# Patient Record
Sex: Male | Born: 1935 | Marital: Married
Health system: Southern US, Community
[De-identification: ages and names within clinical notes are randomized; demographics above are authoritative.]

---

## 2010-06-28 ENCOUNTER — Inpatient Hospital Stay: Payer: Self-pay | Admitting: Internal Medicine

## 2011-01-17 ENCOUNTER — Emergency Department: Payer: Self-pay | Admitting: Unknown Physician Specialty

## 2013-02-18 ENCOUNTER — Encounter: Payer: Self-pay | Admitting: Neurology

## 2013-03-09 ENCOUNTER — Emergency Department: Payer: Self-pay | Admitting: Emergency Medicine

## 2013-03-09 LAB — BASIC METABOLIC PANEL
Anion Gap: 4 — ABNORMAL LOW (ref 7–16)
BUN: 12 mg/dL (ref 7–18)
Calcium, Total: 9.3 mg/dL (ref 8.5–10.1)
Chloride: 107 mmol/L (ref 98–107)
Co2: 27 mmol/L (ref 21–32)
EGFR (African American): >60
EGFR (Non-African Amer.): 55 — ABNORMAL LOW
Osmolality: 275 (ref 275–301)
Potassium: 4.3 mmol/L (ref 3.5–5.1)
Sodium: 138 mmol/L (ref 136–145)

## 2013-03-09 LAB — CBC
HGB: 15.1 g/dL (ref 13.0–18.0)
MCH: 30.4 pg (ref 26.0–34.0)
MCHC: 34.4 g/dL (ref 32.0–36.0)
RDW: 14.2 % (ref 11.5–14.5)
WBC: 6 10*3/uL (ref 3.8–10.6)

## 2013-03-09 LAB — TSH: Thyroid Stimulating Horm: 0.026 u[IU]/mL — ABNORMAL LOW

## 2013-03-20 ENCOUNTER — Encounter: Payer: Self-pay | Admitting: Neurology

## 2013-04-20 ENCOUNTER — Encounter: Payer: Self-pay | Admitting: Neurology

## 2013-12-08 ENCOUNTER — Inpatient Hospital Stay: Payer: Self-pay | Admitting: Internal Medicine

## 2013-12-08 LAB — TSH
Thyroid Stimulating Horm: 0.552 u[IU]/mL
Thyroid Stimulating Horm: 0.56 u[IU]/mL

## 2013-12-08 LAB — COMPREHENSIVE METABOLIC PANEL
ANION GAP: 5 — AB (ref 7–16)
AST: 26 U/L (ref 15–37)
Albumin: 2.8 g/dL — ABNORMAL LOW (ref 3.4–5.0)
Alkaline Phosphatase: 65 U/L
BUN: 29 mg/dL — ABNORMAL HIGH (ref 7–18)
Bilirubin,Total: 1 mg/dL (ref 0.2–1.0)
CALCIUM: 9.2 mg/dL (ref 8.5–10.1)
CO2: 28 mmol/L (ref 21–32)
Chloride: 103 mmol/L (ref 98–107)
Creatinine: 1.43 mg/dL — ABNORMAL HIGH (ref 0.60–1.30)
EGFR (Non-African Amer.): 45 — ABNORMAL LOW
GFR CALC AF AMER: 52 — AB
GLUCOSE: 105 mg/dL — AB (ref 65–99)
Osmolality: 278 (ref 275–301)
POTASSIUM: 3.5 mmol/L (ref 3.5–5.1)
SGPT (ALT): 17 U/L (ref 12–78)
Sodium: 136 mmol/L (ref 136–145)
TOTAL PROTEIN: 7.9 g/dL (ref 6.4–8.2)

## 2013-12-08 LAB — URINALYSIS, COMPLETE
Bilirubin,UR: NEGATIVE
Glucose,UR: NEGATIVE mg/dL (ref 0–75)
Leukocyte Esterase: NEGATIVE
Nitrite: NEGATIVE
Ph: 5 (ref 4.5–8.0)
Specific Gravity: 1.023 (ref 1.003–1.030)
Squamous Epithelial: NONE SEEN
WBC UR: 1 /HPF (ref 0–5)

## 2013-12-08 LAB — CK-MB: CK-MB: 2.2 ng/mL (ref 0.5–3.6)

## 2013-12-08 LAB — CBC
HCT: 41.7 % (ref 40.0–52.0)
HGB: 14.2 g/dL (ref 13.0–18.0)
MCH: 31 pg (ref 26.0–34.0)
MCHC: 34 g/dL (ref 32.0–36.0)
MCV: 91 fL (ref 80–100)
PLATELETS: 161 10*3/uL (ref 150–440)
RBC: 4.58 10*6/uL (ref 4.40–5.90)
RDW: 13.3 % (ref 11.5–14.5)
WBC: 7.9 10*3/uL (ref 3.8–10.6)

## 2013-12-08 LAB — TROPONIN I
Troponin-I: <0.02 ng/mL
Troponin-I: 0.61 ng/mL — ABNORMAL HIGH

## 2013-12-08 LAB — HEMOGLOBIN A1C: Hemoglobin A1C: 5.3 % (ref 4.2–6.3)

## 2013-12-08 LAB — LIPASE, BLOOD: Lipase: 71 U/L — ABNORMAL LOW (ref 73–393)

## 2013-12-08 LAB — CK: CK, Total: 27 U/L — ABNORMAL LOW

## 2013-12-09 DIAGNOSIS — I214 Non-ST elevation (NSTEMI) myocardial infarction: Secondary | ICD-10-CM

## 2013-12-09 DIAGNOSIS — I059 Rheumatic mitral valve disease, unspecified: Secondary | ICD-10-CM

## 2013-12-09 DIAGNOSIS — I10 Essential (primary) hypertension: Secondary | ICD-10-CM

## 2013-12-09 LAB — COMPREHENSIVE METABOLIC PANEL
ANION GAP: 7 (ref 7–16)
Albumin: 2.3 g/dL — ABNORMAL LOW (ref 3.4–5.0)
Alkaline Phosphatase: 53 U/L
BILIRUBIN TOTAL: 0.9 mg/dL (ref 0.2–1.0)
BUN: 22 mg/dL — ABNORMAL HIGH (ref 7–18)
CALCIUM: 8.4 mg/dL — AB (ref 8.5–10.1)
Chloride: 109 mmol/L — ABNORMAL HIGH (ref 98–107)
Co2: 23 mmol/L (ref 21–32)
Creatinine: 1.14 mg/dL (ref 0.60–1.30)
EGFR (African American): >60
GFR CALC NON AF AMER: 59 — AB
GLUCOSE: 89 mg/dL (ref 65–99)
Osmolality: 280 (ref 275–301)
Potassium: 3.4 mmol/L — ABNORMAL LOW (ref 3.5–5.1)
SGOT(AST): 34 U/L (ref 15–37)
SGPT (ALT): 22 U/L (ref 12–78)
Sodium: 139 mmol/L (ref 136–145)
TOTAL PROTEIN: 6.6 g/dL (ref 6.4–8.2)

## 2013-12-09 LAB — CBC WITH DIFFERENTIAL/PLATELET
BASOS ABS: 0 10*3/uL (ref 0.0–0.1)
Basophil %: 0.3 %
EOS ABS: 0.3 10*3/uL (ref 0.0–0.7)
Eosinophil %: 3.3 %
HCT: 40.4 % (ref 40.0–52.0)
HGB: 13.4 g/dL (ref 13.0–18.0)
Lymphocyte #: 0.9 10*3/uL — ABNORMAL LOW (ref 1.0–3.6)
Lymphocyte %: 12 %
MCH: 30.1 pg (ref 26.0–34.0)
MCHC: 33.1 g/dL (ref 32.0–36.0)
MCV: 91 fL (ref 80–100)
MONO ABS: 0.9 x10 3/mm (ref 0.2–1.0)
MONOS PCT: 11.2 %
NEUTROS PCT: 73.2 %
Neutrophil #: 5.6 10*3/uL (ref 1.4–6.5)
Platelet: 161 10*3/uL (ref 150–440)
RBC: 4.44 10*6/uL (ref 4.40–5.90)
RDW: 13.3 % (ref 11.5–14.5)
WBC: 7.7 10*3/uL (ref 3.8–10.6)

## 2013-12-09 LAB — CK-MB
CK-MB: 3 ng/mL (ref 0.5–3.6)
CK-MB: 2.8 ng/mL (ref 0.5–3.6)

## 2013-12-09 LAB — APTT
Activated PTT: 42.6 secs — ABNORMAL HIGH (ref 23.6–35.9)
Activated PTT: 46.9 secs — ABNORMAL HIGH (ref 23.6–35.9)

## 2013-12-09 LAB — TROPONIN I
Troponin-I: 0.97 ng/mL — ABNORMAL HIGH
Troponin-I: 0.86 ng/mL — ABNORMAL HIGH

## 2013-12-10 DIAGNOSIS — R079 Chest pain, unspecified: Secondary | ICD-10-CM

## 2013-12-10 DIAGNOSIS — R5381 Other malaise: Secondary | ICD-10-CM

## 2013-12-10 DIAGNOSIS — R5383 Other fatigue: Secondary | ICD-10-CM

## 2013-12-10 DIAGNOSIS — I200 Unstable angina: Secondary | ICD-10-CM

## 2013-12-10 LAB — LIPID PANEL
CHOLESTEROL: 127 mg/dL (ref 0–200)
HDL Cholesterol: 19 mg/dL — ABNORMAL LOW (ref 40–60)
LDL CHOLESTEROL, CALC: 82 mg/dL (ref 0–100)
Triglycerides: 128 mg/dL (ref 0–200)
VLDL Cholesterol, Calc: 26 mg/dL (ref 5–40)

## 2013-12-10 LAB — APTT
Activated PTT: 58.7 secs — ABNORMAL HIGH (ref 23.6–35.9)
Activated PTT: 48.7 secs — ABNORMAL HIGH (ref 23.6–35.9)

## 2013-12-10 LAB — URINE CULTURE

## 2013-12-11 LAB — CBC WITH DIFFERENTIAL/PLATELET
BASOS ABS: 0.1 10*3/uL (ref 0.0–0.1)
Basophil %: 0.6 %
EOS ABS: 0.1 10*3/uL (ref 0.0–0.7)
Eosinophil %: 1.3 %
HCT: 35.8 % — ABNORMAL LOW (ref 40.0–52.0)
HGB: 12.1 g/dL — ABNORMAL LOW (ref 13.0–18.0)
Lymphocyte #: 1 10*3/uL (ref 1.0–3.6)
Lymphocyte %: 9.7 %
MCH: 30.8 pg (ref 26.0–34.0)
MCHC: 33.9 g/dL (ref 32.0–36.0)
MCV: 91 fL (ref 80–100)
Monocyte #: 0.9 x10 3/mm (ref 0.2–1.0)
Monocyte %: 8.4 %
NEUTROS ABS: 8.1 10*3/uL — AB (ref 1.4–6.5)
Neutrophil %: 80 %
Platelet: 190 10*3/uL (ref 150–440)
RBC: 3.93 10*6/uL — ABNORMAL LOW (ref 4.40–5.90)
RDW: 13.6 % (ref 11.5–14.5)
WBC: 10.1 10*3/uL (ref 3.8–10.6)

## 2013-12-11 LAB — BASIC METABOLIC PANEL WITH GFR
Anion Gap: 11
BUN: 10 mg/dL
Calcium, Total: 7.5 mg/dL — ABNORMAL LOW
Chloride: 110 mmol/L — ABNORMAL HIGH
Co2: 19 mmol/L — ABNORMAL LOW
Creatinine: 1.16 mg/dL
EGFR (African American): >60
EGFR (Non-African Amer.): 58 — ABNORMAL LOW
Glucose: 73 mg/dL
Osmolality: 277
Potassium: 3.3 mmol/L — ABNORMAL LOW
Sodium: 140 mmol/L

## 2013-12-11 LAB — TROPONIN I: TROPONIN-I: 0.17 ng/mL — AB

## 2013-12-12 LAB — MAGNESIUM: MAGNESIUM: 1.8 mg/dL

## 2013-12-13 LAB — BASIC METABOLIC PANEL
ANION GAP: 7 (ref 7–16)
BUN: 8 mg/dL (ref 7–18)
CHLORIDE: 110 mmol/L — AB (ref 98–107)
CO2: 23 mmol/L (ref 21–32)
CREATININE: 1.41 mg/dL — AB (ref 0.60–1.30)
Calcium, Total: 7.8 mg/dL — ABNORMAL LOW (ref 8.5–10.1)
GFR CALC AF AMER: 53 — AB
GFR CALC NON AF AMER: 46 — AB
GLUCOSE: 97 mg/dL (ref 65–99)
Osmolality: 278 (ref 275–301)
POTASSIUM: 3.4 mmol/L — AB (ref 3.5–5.1)
SODIUM: 140 mmol/L (ref 136–145)

## 2013-12-14 LAB — PATHOLOGY REPORT

## 2014-12-11 NOTE — Discharge Summary (Signed)
PATIENT NAME:  Samuel Charles, Samuel Charles MR#:  [id number] DATE OF BIRTH:  07-May-1929  DATE OF ADMISSION:  12/08/2013 DATE OF DISCHARGE:  12/13/2013  PRIMARY CARE PHYSICIAN: Dr. Juel BurrowMasoud.   DISCHARGE DIAGNOSES:  1. Appendicitis.  2. Elevated troponin, likely demand ischemia.  3. Dehydration.  4. Renal insufficiency.   CONDITION: Stable.   CODE STATUS: Full code.   MEDICATIONS: Refer to the medication reconciliation list.   The patient needs home health and physical therapy.   DIET: Low-sodium, low-fat, low-cholesterol diet.   ACTIVITY: As tolerated.   FOLLOWUP CARE: Follow up with PCP within 1-2 weeks. Follow up with Dr. Kirke CorinArida, cardiology within 1 to 2 weeks. Follow up with Dr. Excell Seltzerooper, surgeon, within 1 week   CONSULTATIONS: Cardiology, Dr. Kirke CorinArida, general surgery Dr. Excell Seltzerooper, and GI, Dr. Mechele CollinElliott.    REASON FOR ADMISSION: Nausea, vomiting, some epigastric abdominal pain for 2 months intermittently and worsening for 3-4 days.   HOSPITAL COURSE: The patient is an 81 year old Caucasian male with a history of CAD, hypertension and hyperlipidemia, who presented to the ED with nausea, vomiting and epigastric pain. The patient has an elevated creatinine at 1.43. A hospitalist was contacted for admission. For detailed history and physical examination, please refer to the admission note dictated by Dr. Winona LegatoVaickute.  LABORATORY DATA: On admission date showed a BUN 29, creatinine 1.43, and lipase 71, hemoglobin 14.2. WBC 7.9. Urinalysis is negative.   Chest x-ray did not show any acute cardiopulmonary disease.   HOSPITAL COURSE:  1. Abdominal pain and nausea and vomiting. The patient was suspected to have a cholecystitis initially. The patient got a CAT scan for his abdominal pain. The patient had epigastric abdominal pain with nausea, vomiting. Dr. Markham JordanElliot evaluated the patient, suggested a CAT scan of abdomen to rule out gallbladder stone, which showed acute appendicitis. So Dr. Excell Seltzerooper, general surgeon,   suggested urgent surgery. The patient underwent appendectomy on 12/10/2013 and after appendectomy, the patient has been treated with Zosyn IV.  2. Elevated troponin. The patient's troponin first 2 sets was negative. However, the third set was 0.61 and then increased to 0.97. Dr. Kirke CorinArida evaluated the patient, suggested it was demand ischemia. He suggested if patient has chest pain, may need a cardiac catheterization in the hospital. If patient has no chest pain, may be discharged after recovery from surgery and then follow up him as outpatient for possible cath. The patient has no chest pain after surgery. So Dr. Kirke CorinArida suggested to follow up with him as outpatient. 3. Dehydration has improved after IV fluid support.  4. The patient has no complaints after surgery. The patient had a bowel movement. No abdominal pain and nausea, vomiting, or diarrhea. Vital signs are stable.   PHYSICAL EXAMINATION: Unremarkable.   The patient is clinically stable, will be discharged to home today. Discussed the patient's discharge plan with the patient and the patient's family member, Dr. Kirke CorinArida and Dr. Excell Seltzerooper, nurse, case manager.   TIME SPENT: About 38 minutes.   The patient will be discharged to home with home health and PT today.     ____________________________ Shaune PollackQing Tawan Corkern, MD qc:lt Charles: 12/13/2013 16:46:42 ET T: 12/14/2013 05:25:33 ET JOB#: [id number]  cc: Shaune PollackQing Kennesha Brewbaker, MD, <Dictator> Shaune PollackQING Jernie Schutt MD ELECTRONICALLY SIGNED 12/14/2013 18:34

## 2014-12-11 NOTE — Consult Note (Signed)
Pt pain is gone, abd is not tender to touch or palpation.  He may have passed a stone, could have gastritis or ulcer disease feeling better with treatment.  Proceed with CT of abdomen and if neg consider cath.  Electronic Signatures: Scot JunElliott, Akili Cuda T (MD)  (Signed on 22-Apr-15 15:28)  Authored  Last Updated: 22-Apr-15 15:28 by Scot JunElliott, Sefora Tietje T (MD)

## 2014-12-11 NOTE — Consult Note (Signed)
General Aspect 81 y/o male with h/o nonobs CAD who was admitted w/ abd pain and found to have an elevated troponin.   Present Illness . 81 y/o male with a prior h/o nonobstructive CAD, HTN, and HL.  He is s/p diagnostic cath ~ 10 yrs ago and says that he was found to have moderate 2 vessel CAD.  He has been medically managed and has not been seen by cardiology in some time.  Over the past several years, he has been experiencing almost daily, exertional substernal chest pressure and dyspnea.  His PCP placed him on sl ntg prn and he takes this regularly.  He has not undergone any form of ischemic testing recently.  Over the past several mos, he has noted postprandial abdominal bloating, discomfort, and tenderness often associated with nausea and vomiting.  Initially he thought that he had just gotten food poisoning from whatever restaurant he had gone to but when he began to have Ss no matter where he went, he realized something more organic was going on.  On Saturday 4/18, his abdominal discomfort became more persistent and he developed n/v after every meal.  In the setting of not being able to keep anything down, he presented to the ED yesterday.    In the ED, his  creat was mildly elevated.  CXR wnl.  ECG with LVH but otw nl.  Initial trop was nl but has since risen and peaked @ 0.97 and is currently 0.86.  He has not had chest pain since admission but continues to have diffuse abdominal discomfort and RUQ tenderness.  U/S this AM shows cholelithiasis w/o cholecystitis.  Plan is for HIDA scan.   Physical Exam:  GEN well developed, Pleasant, NAD.   HEENT pink conjunctivae, moist oral mucosa   NECK supple  No masses   RESP normal resp effort  clear BS   CARD Regular rate and rhythm  Bradycardic  Normal, S1, S2  soft systolic murmur along the sternal border.   ABD soft, diffusely tender, hypoactive BS.   LYMPH negative neck   EXTR negative cyanosis/clubbing, negative edema, distal pules 1+  bilat   SKIN normal to palpation   NEURO follows commands, motor/sensory function intact   PSYCH alert, A+O to time, place, person, good insight   Review of Systems:  Subjective/Chief Complaint Nasuea, malaise, anorexia, chest pain with exertion   General: Weight loss or gain  hasn't been eating well for several mos r/t postprandial n/v.   Skin: No Complaints   ENT: No Complaints   Eyes: No Complaints   Neck: No Complaints   Respiratory: Short of breath  doe   Cardiovascular: exertional sscp as above.   Gastrointestinal: Nausea  abd bloating and tenderness.   Genitourinary: No Complaints   Vascular: No Complaints   Musculoskeletal: No Complaints   Neurologic: No Complaints   Hematologic: No Complaints   Endocrine: No Complaints   Psychiatric: No Complaints   Review of Systems: All other systems were reviewed and found to be negative   Medications/Allergies Reviewed Medications/Allergies reviewed   Family & Social History:  Family and Social History:  Family History all of his sisters died of MI's.   Social History negative tobacco, negative ETOH, negative Illicit drugs, Lives in [place] with his wife.  They eat out often.   Place of Living Home     Abdominal Pain:    Nausea and Vomiting:    Nonobstructive CAD: a. nonobs cath ~ 10 yrs ago  HTN:    Hypercholesterolemia:   Home Medications:  Medication Instructions Status  Aspirin Low Dose 81 mg oral delayed release tablet 1 tab(s) orally once a day Active  Nitrostat 0.4 mg sublingual tablet 1 tab(s) sublingual every 5 minutes, As Needed - for Chest Pain Active  lisinopril 20 mg oral tablet 1 tab(s) orally once a day Active  levothyroxine 100 mcg (0.1 mg) oral tablet 1 tab(s) orally once a day  Active   Lab Results:  Hepatic:  22-Apr-15 01:56   Bilirubin, Total 0.9  Alkaline Phosphatase 53 (45-117 NOTE: New Reference Range 07/10/13)  SGPT (ALT) 22  SGOT (AST) 34  Total Protein, Serum  6.6  Albumin, Serum  2.3  Routine Chem:  22-Apr-15 01:56   Result Comment TROPONIN - RESULTS VERIFIED BY REPEAT TESTING.  - PREVIOUS CALL:12/08/2013 AT 2205..TPL  Result(s) reported on 09 Dec 2013 at 03:11AM.  Glucose, Serum 89  BUN  22  Creatinine (comp) 1.14  Sodium, Serum 139  Potassium, Serum  3.4  Chloride, Serum  109  CO2, Serum 23  Calcium (Total), Serum  8.4  Osmolality (calc) 280  eGFR (African American) >60  eGFR (Non-African American)  59 (eGFR values <20m/min/1.73 m2 may be an indication of chronic kidney disease (CKD). Calculated eGFR is useful in patients with stable renal function. The eGFR calculation will not be reliable in acutely ill patients when serum creatinine is changing rapidly. It is not useful in  patients on dialysis. The eGFR calculation may not be applicable to patients at the low and high extremes of body sizes, pregnant women, and vegetarians.)  Anion Gap 7  Cardiac:  21-Apr-15 13:43   Troponin I < 0.02 (0.00-0.05 0.05 ng/mL or less: NEGATIVE  Repeat testing in 3-6 hrs  if clinically indicated. >0.05 ng/mL: POTENTIAL  MYOCARDIAL INJURY. Repeat  testing in 3-6 hrs if  clinically indicated. NOTE: An increase or decrease  of 30% or more on serial  testing suggests a  clinically important change)    18:35   Troponin I < 0.02 (0.00-0.05 0.05 ng/mL or less: NEGATIVE  Repeat testing in 3-6 hrs  if clinically indicated. >0.05 ng/mL: POTENTIAL  MYOCARDIAL INJURY. Repeat  testing in 3-6 hrs if  clinically indicated. NOTE: An increase or decrease  of 30% or more on serial  testing suggests a  clinically important change)    21:20   Troponin I  0.61 (0.00-0.05 0.05 ng/mL or less: NEGATIVE  Repeat testing in 3-6 hrs  if clinically indicated. >0.05 ng/mL: POTENTIAL  MYOCARDIAL INJURY. Repeat  testing in 3-6 hrs if  clinically indicated. NOTE: An increase or decrease  of 30% or more on serial  testing suggests a  clinically important  change)  22-Apr-15 01:56   Troponin I  0.97 (0.00-0.05 0.05 ng/mL or less: NEGATIVE  Repeat testing in 3-6 hrs  if clinically indicated. >0.05 ng/mL: POTENTIAL  MYOCARDIAL INJURY. Repeat  testing in 3-6 hrs if  clinically indicated. NOTE: An increase or decrease  of 30% or more on serial  testing suggests a  clinically important change)  CPK-MB, Serum 3.0 (Result(s) reported on 09 Dec 2013 at 02:41AM.)    05:19   Troponin I  0.86 (0.00-0.05 0.05 ng/mL or less: NEGATIVE  Repeat testing in 3-6 hrs  if clinically indicated. >0.05 ng/mL: POTENTIAL  MYOCARDIAL INJURY. Repeat  testing in 3-6 hrs if  clinically indicated. NOTE: An increase or decrease  of 30% or more on serial  testing suggests a  clinically  important change)  Routine Hem:  22-Apr-15 01:56   WBC (CBC) 7.7  RBC (CBC) 4.44  Hemoglobin (CBC) 13.4  Hematocrit (CBC) 40.4  Platelet Count (CBC) 161  MCV 91  MCH 30.1  MCHC 33.1  RDW 13.3  Neutrophil % 73.2  Lymphocyte % 12.0  Monocyte % 11.2  Eosinophil % 3.3  Basophil % 0.3  Neutrophil # 5.6  Lymphocyte #  0.9  Monocyte # 0.9  Eosinophil # 0.3  Basophil # 0.0 (Result(s) reported on 09 Dec 2013 at 02:12AM.)   EKG:  EKG Interp. by me  NSR   Interpretation EKG shows RSR, 97, LVH, no acute ST/T changes.   Radiology Results: XRay:    21-Apr-15 15:48, Chest PA and Lateral  Chest PA and Lateral   REASON FOR EXAM:    weakness  COMMENTS:       PROCEDURE: DXR - DXR CHEST PA (OR AP) AND LATERAL  - Dec 08 2013  3:48PM     CLINICAL DATA:  weakness    EXAM:  CHEST  2 VIEW    COMPARISON:  DG CHEST 1V PORT dated 03/09/2013    FINDINGS:  The heart size and mediastinal contours are within normal limits.  Both lungs are clear. The visualized skeletal structures are  unremarkable.     IMPRESSION:  No active cardiopulmonary disease.      Electronically Signed    By: Margaree Mackintosh M.D.    On: 04/21/201515:48         Verified By: Mikki Santee,  M.D., MD  [place]:    22-Apr-15 09:00, US Abdomen Limited Survey  US Abdomen Limited Survey   REASON FOR EXAM:    RUQ pain, r/o cholecystitis  COMMENTS:   Body Site: Gallbladder, Liver, Common Bile Duct    PROCEDURE: [place]  - US ABDOMEN LIMITED SURVEY  - Dec 09 2013  9:00AM     CLINICAL DATA:  Right upper quadrant abdomen pain. Assess for acute  cholecystitis.    EXAM:  US ABDOMEN LIMITED - RIGHT UPPER QUADRANT    COMPARISON:  None.    FINDINGS:  Gallbladder:  There are sludge and gallstones within the gallbladder. There is no  sonographic Murphy sign. No pericholecystic fluid is noted.    Common bile duct:    Diameter: 6.9 mm.    Liver:    No focal lesion identified. Within normal limits in parenchymal  echogenicity.    Others:    There is incidental note of a 3.7 cm simple cyst in the midpole  right kidney.     IMPRESSION:  Cholelithiasis without sonographic evidence of acute cholecystitis.      Electronically Signed    By: Abelardo Diesel M.D.    On: 12/09/2013 09:04         Verified By: Abelardo Diesel, M.D.,    No Known Allergies:   Vital Signs/Nurse's Notes: **Vital Signs.:   22-Apr-15 04:08  Vital Signs Type Q 4hr  Temperature Temperature (F) 97.8  Celsius 36.5  Temperature Source oral  Pulse Pulse 86  Respirations Respirations 18  Systolic BP Systolic BP 626  Diastolic BP (mmHg) Diastolic BP (mmHg) 68  Mean BP 82  Pulse Ox % Pulse Ox % 97  Pulse Ox Activity Level  At rest  Oxygen Delivery Room Air/ 21 %  *Intake and Output.:   Daily 22-Apr-15 07:00  Grand Totals Intake:   Output:  300    Net:  -[place].:  -300  Urine ml     Out:  300  Length of Stay Totals Intake:   Output:  300    Net:  -300    Impression 1.  NSTEMI:   Suspect type II NSTEMI in setting of persistent abdominal pain/tenderness, nausea, vomiting, and cholelithiasis.  That said, he does have a long h/o exertional sscp and dyspnea.  He also has a h/o nonobs CAD by cath ~ 10  yrs ago and has not had ischemic testing since.  He is not actively having chest pain however symptoms are very concerning.   -We will add heparin and statin and place on tele.   -Cont asa/bb. Check echo today.   -He will require diagnostic catheterization to r/o obs CAD.   If he requires PCI, he would then require P2Y12 inhibition, which would delay surgery if needed.   We will work with GI to determine the timing of procedures.  2.  Abd Pain/N/V/Cholelithiasis:   GB u/s this AM showed cholelithiasis w/o cholecystitis.  Pending HIDA scan and possibly EGD/Colon once coronary anatomy better understood.  3.  HTN:   stable.  Cont bb.  Home dose of ACEI on hold in setting of mild renal insuff on admission.  4.  Lipids:   check fasting lipids in AM.  Adding statin in setting of ACS.  LFT's ok.   Electronic Signatures: Rogelia Mire (NP)  (Signed 22-Apr-15 11:11)  Authored: General Aspect/Present Illness, History and Physical Exam, Review of System, Family & Social History, Past Medical History, Home Medications, Labs, EKG , Radiology, Allergies, Vital Signs/Nurse's Notes, Impression/Plan Ida Rogue (MD)  (Signed 22-Apr-15 13:09)  Authored: General Aspect/Present Illness, History and Physical Exam, Review of System, Home Medications, Labs, EKG , Impression/Plan  Co-Signer: General Aspect/Present Illness, History and Physical Exam, Review of System, Family & Social History, Past Medical History, Home Medications, Labs, EKG , Radiology, Allergies, Vital Signs/Nurse's Notes, Impression/Plan   Last Updated: 22-Apr-15 13:09 by Ida Rogue (MD)

## 2014-12-11 NOTE — Consult Note (Signed)
Patients who are in the periinfarct period and need to have UGI tract evaluated may do better with CT and or UGI unless therapeutic manuver needed for urgent bleeding, FB obst, etc.  If no significant abnormality seen on these studies then can proceed with cath and stents if needed.  if then bleeding occurs  from a flat lesion that did not show up on xrays then will then have to deal with it with endoscopic tools. Will order CT of abdomen for tomorrow with at least oral contrast.  Electronic Signatures: Scot JunElliott, Vernor Monnig T (MD)  (Signed on 22-Apr-15 14:37)  Authored  Last Updated: 22-Apr-15 14:37 by Scot JunElliott, Merrell Borsuk T (MD)

## 2014-12-11 NOTE — Consult Note (Signed)
I will sign off.  Dr Bluford Kaufmannh is on call this weekend  [facility] on this weekend if GI problems arise.    Electronic Signatures: Scot JunElliott, Robert T (MD) (Signed on 24-Apr-15 15:52)  Authored   Last Updated: 24-Apr-15 15:53 by Scot JunElliott, Robert T (MD)

## 2014-12-11 NOTE — H&P (Signed)
PATIENT NAME:  Samuel Charles, Samuel Charles MR#:  [id number] DATE OF BIRTH:  04/22/1929  DATE OF ADMISSION:  12/08/2013  PRIMARY CARE PHYSICIAN: Corky Downs, MD  CARDIOLOGIST: Bobbie Stack. Callwood, MD  HISTORY OF PRESENT ILLNESS:  The patient is an 81 year old Caucasian male with past medical history significant for history of coronary artery disease, status post cardiac catheterization approximately 10 years ago which revealed some blockages by Dr. Juliann Pares, also stable angina for years now for which Dr. Juel Burrow prescribed only nitroglycerin as needed.  Also history of hypertension as well as hyperlipidemia.  He presented to the hospital with complaints of intermittent episodes of sickness. Apparently, the patient has been having episodes of nausea, vomiting and dizziness as well as some epigastric abdominal pain for the past 2 months now intermittently. Now he presents with 3 to 4-day history of feeling as if he has a heaviness in the stomach and upper abdomen, mostly on the right side. The pain is described as soreness, comes and goes and mostly in the right side intermittently, increasing whenever he eats, associated with nausea and vomiting but no significant radiation. He was noted to be dehydrated with elevated creatinine level of 1.43 and hospitalist services were contacted for admission. His liver enzymes are unremarkable. His lipase level is also normal.   PAST MEDICAL HISTORY:  Significant for history of coronary artery disease, status post catheterization approximately 10 years ago by Dr. Juliann Pares which revealed some coronary artery disease. The patient has been having problems with stable angina or angina on exertion for a prolonged period of time for which he is receiving nitroglycerin as needed. Also history of hyperlipidemia and hypertension, also gastroesophageal reflux disease.    PAST SURGICAL HISTORY:  None.    MEDICATIONS: The patient is on her aspirin 81 mg p.o. daily, levothyroxine 100 mcg p.o.  daily, lisinopril 10 mg p.o. daily, Nitrostat 0.4 mg every 5 minutes as needed.   ALLERGIES:  None.  FAMILY HISTORY:  Coronary artery disease as well as hypertension in the patient's family.  The patient's 1 sister died at the age of 26 of myocardial infarction. No cancer, no diabetes.   SOCIAL HISTORY: The patient is married, has 2 boys and 2 girls, 1 grandson who is 62 years old. No smoking, drinks approximately 1 beer in 2 weeks. 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   REVIEW OF SYSTEMS:  CONSTITUTIONAL:  Positive for fatigue and weakness. He is not able to move much now because of significant dehydration and weakness. Admits of having upper abdominal pain and questionable weight loss. Admits to beginning of cataracts, also bifocal glasses. Some chest pain. Stable angina which has been going on for a while now.  Feeling lightheaded intermittently, also intermittent nausea and vomiting for a few months now. Epigastric abdominal pains as mentioned above. Admits to having some benign prostatic hypertrophy which he makes his urine difficult to expel. Denies any high fevers, weight loss or gain.  EYES: Denies any blurry vision, double vision or glaucoma.  ENT: Denies any tinnitus, allergies, epistaxis, sinus problems, dentures, difficulty swallowing.   RESPIRATORY:  Denies any cough, wheezes, asthma, COPD.   CARDIOVASCULAR: Denies orthopnea, edema, arrhythmias, palpitations or syncope.  GASTROINTESTINAL: Denies any diarrhea, rectal bleeding, change in bowel habits.  GENITOURINARY: Denies dysuria, hematuria, frequency or incontinence.  ENDOCRINOLOGY: Denies any polydipsia, nocturia, thyroid problems, heat or cold intolerance. HEMATOLOGIC:  Denies any anemia, easy  bruising, bleeding or swollen glands. SKIN:  Denies any acne, rashes, lesions or change in moles.  MUSCULOSKELETAL: Denies arthritis, cramps, swelling,  gout. NEUROLOGIC:  Denies numbness, epilepsy or tremor. PSYCHIATRIC: Denies anxiety, insomnia or depression.   PHYSICAL EXAMINATION: VITAL SIGNS: On arrival to the hospital, temperature was 98 by mouth, pulse, 105. Respiratory rate 18, blood pressure 147/74, saturation is 97% on room air.  GENERAL: This is a well-developed, well-nourished, thin, Caucasian male in moderate distress, laying on the stretcher. He is uncomfortable whenever you touch his abdomen, is somewhat nauseous and is burping.   HEENT:  Pupils equal and reactive to light.  Extraocular muscles are intact.  No icterus or conjunctivitis.  Has normal hearing. No pharyngeal erythema. Mucosa is moist.  NECK:  No masses. Supple, nontender. Thyroid not enlarged.  No adenopathy, no JVD, no carotid bruits bilaterally.  Full range of motion.  LUNGS: Clear to auscultation in all fields. No rales, rhonchi, diminished breath sounds or wheezing. No labored inspirations, increased effort, dullness to percussion or respiratory distress.  CARDIOVASCULAR: S1, S2 present.  No murmurs or gallops heard. Rhythm is regular.  PMI not lateralized.  Chest is nontender to palpation.  Pedal pulses 1+. No lower extremity edema, calf tenderness or cyanosis was noted. ABDOMEN: Soft, minimally uncomfortable in the epigastric area whenever I palpate but no rebound or guarding were noted. No right upper quadrant discomfort at present. No hepatosplenomegaly or masses were noted.  RECTAL: Deferred. The patient is having burping sensation and feeling like he is about to vomit whenever you touch his abdomen and palpate his abdomen.   MUSCLE STRENGTH: Able to move all extremities. No cyanosis, degenerative joint disease. Mild kyphosis noted. Gait is not tested.  SKIN: Did not reveal any rashes, lesions, erythema, nodularity or induration. He has some reddened skin in the face that I believe is facial sun damage.  LYMPHATIC: No adenopathy in the cervical region.   NEUROLOGICAL: Cranial nerves II through XII grossly intact. Sensory is intact. No dysarthria or aphasia. The patient is alert to time, person, place, cooperative. Memory is good.  PSYCHIATRIC: No significant confusion, agitation or depression noted.   LABORATORY, DIAGNOSTIC AND RADIOLOGICAL DATA:  BMP revealed glucose of 105, BUN and creatinine were 29 and 1.43, otherwise BMP was unremarkable. Lipase level was 71, albumin level 2.8, otherwise unremarkable liver enzymes. CK level was 27, MB fraction was not checked and troponin was less than 0.02. TSH was normal at 0.552. White blood cell count was 7.9, hemoglobin 14.2, platelet count 151. Urinalysis yellow clear urine, negative for glucose, bilirubin; trace ketones, specific gravity 1.023, pH was 5.0, 1+ blood, 100 mg/dL protein, negative for nitrites or leukocyte esterase, less than 1 red blood cell, 1 white blood cell, trace bacteria, no epithelial cells, mucus was present as well as amorphous crystals. EKG showed sinus rhythm with premature supraventricular complexes at 97 beats per minute, normal axis, minimal voltage criteria for left ventricular hypertrophy may be normal according to EKG criteria, no acute ST-T changes were noted, borderline EKG according to EKG criteria. Radiologic studies: Chest x-ray, PA and lateral, 21st of April 2015, revealed no active cardiopulmonary disease.   ASSESSMENT AND PLAN: 1.  Right upper quadrant pain with intermittent nausea, vomiting as well as dehydration concerning for gallbladder disease, however, cannot rule out intermittent unstable angina. We will get ultrasound of abdomen and we will also get a HIDA scan if ultrasound is unremarkable. We will get a gastroenterologist involved and surgeon if needed. We  will continue the patient on clear liquid diet as well as IV fluids for now and pain medications.  2.  Dehydration. Will continue IV fluids as well as antiemetics.  3.  Gastroesophageal reflux disease. Start  the patient on proton pump inhibitor.    4.  Renal insufficiency. We will continue IV fluids, get bladder scan and will initiate the patient on Flomax as the patient reports benign prostatic hypertrophy. We will hold ACE inhibitor for now.  5.  Hypothyroidism. Continue Synthroid. Patient's TSH was checked and was normal.  6.  History of stable angina. We will get Dr. Glennis Brink consultation. The patient would benefit from cardiac evaluation, possibly even cardiac catheterization. He had cardiac catheterization approximately 10 years ago. At that time, it showed blockages. The patient is, however, active, works for pleasure.  I would say he would benefit from evaluation. We will initiate the patient on metoprolol as well as nitroglycerin topically and aspirin therapy for now.     TIME SPENT: 50 minutes.      ____________________________ Katharina Caper, MD rv:cs D: 12/08/2013 17:30:31 ET T: 12/08/2013 18:02:47 ET JOB#: [id number]  cc: Katharina Caper, MD, <Dictator> Corky Downs, MD Herchel Hopkin MD ELECTRONICALLY SIGNED 01/13/2014 8:20

## 2014-12-11 NOTE — Consult Note (Signed)
PATIENT NAME:  Samuel Charles, Samuel Charles MR#:  [id number] DATE OF BIRTH:  1928-09-26  DATE OF CONSULTATION:  12/09/2013  REFERRING PHYSICIAN:   CONSULTING PHYSICIAN:  Manya Silvas, MD  The patient is an 81 year old white male with a history of coronary artery disease, significant catheterization over 10 years ago. He has a history of stable angina. He presented to the ER with epigastric and right upper quadrant abdominal pain for the last couple of months. He was admitted for this and I was asked to see him in consultation.   The patient says for the last few weeks, whenever he eats, he gets pain, nausea and sometimes vomiting. He says he is nauseated all the time and actually has not eaten in 4 days because of the pain.   The pain is described as in the right upper quadrant and epigastric area and somewhat relieved by vomiting.   The patient says he has had dysentery all weekend and denies any history of constipation. Because of vomiting and diarrhea, he has not eaten in 4 days.   PAST MEDICAL HISTORY: Positive for coronary artery disease followed by Dr. Clayborn Bigness for several years. He has a history of  "stable angina." He uses nitroglycerin maybe once a week. He also has angina on exertion.   FAMILY HISTORY: Positive for coronary artery disease and hypertension. The patient's sister died at age 50 of an MI.   ALLERGIES: None.   MEDICATIONS ON ADMISSION: Aspirin 81 mg a day, levothyroxine 100 mcg a day, lisinopril 10 mg a day, Nitrostat 0.4 mg q.5 minutes as needed.   REVIEW OF SYSTEMS: Weakness, fatigue, upper abdominal pain, stable angina and intermittent nausea and vomiting. No asthma or wheezing. No COPD. No edema. No palpitations or syncope. No rectal bleeding. No blood in his vomitus. No dysuria or hematuria.   PHYSICAL EXAMINATION:  GENERAL: Elderly, white male, cooperative, in no acute distress.  VITAL SIGNS: Temperature 97.8, pulse 86, respirations 18, blood pressure 112/68.  HEENT:  Sclerae anicteric. Conjunctivae negative. Tongue negative. Head is atraumatic.  NECK: No carotid bruits.  CHEST: Clear.  HEART: 2/6 systolic murmur. A few irregular beats.  ABDOMEN: Shows tenderness in the right upper quadrant and epigastric area. No palpable pulsatile mass.  EXTREMITIES: No edema.  SKIN: Warm and dry.  PSYCHIATRIC: Mood and affect are appropriate.   LABS: Glucose 89, BUN 22, creatinine 1.14, sodium 139, potassium 3.4, chloride 109, CO2 23. Troponins as mentioned have climbed since admission, now up to 0.97. Albumin 2.3, total bilirubin 0.9, alk phos 53, SGOT 34, SGPT 22. TSH 0.560. CPK-MBs are negative. White count 7.7, hemoglobin 13.4, platelet count 161. Urinalysis shows 1+ blood and some protein. A PA and lateral of the chest showed no active cardiopulmonary disease.   ASSESSMENT: The patient has upper abdominal pain with vomiting and some diarrhea. This could possibly be related to several different diseases, gallbladder disease, acute on chronic cholecystitis, peptic ulcer disease and gastric cancer. Ischemic gastrointestinal disease is possible, less likely.   RECOMMENDATIONS: Proceed with ultrasound and HIDA scan. Because of his rise in troponin and his history of angina, would hold endoscopy and colonoscopy until he is cleared by cardiology. Continue to give IV Protonix and p.r.n. medicine for nausea. Consider a CAT scan of the abdomen if the ultrasound and HIDA scan are negative.  ____________________________ Manya Silvas, MD rte:aw D: 12/09/2013 07:45:57 ET T: 12/09/2013 08:10:03 ET JOB#: [id number]  cc: Manya Silvas, MD, <Dictator> Cletis Athens, MD Dwayne D. Callwood,  MD Manya Silvas MD ELECTRONICALLY SIGNED 12/11/2013 17:12

## 2014-12-11 NOTE — Consult Note (Signed)
Pt had CT showing appendicitis and had surgery for this after cardiology clearance.  Discussed with Dr. Excell Seltzerooper. Pt resting now, chest with good anterior air flow.  No new suggestions.  Electronic Signatures: Scot JunElliott, Robert T (MD)  (Signed on 23-Apr-15 17:52)  Authored  Last Updated: 23-Apr-15 17:52 by Scot JunElliott, Robert T (MD)

## 2014-12-11 NOTE — Consult Note (Signed)
Troponin has climbed since admission.  No EGD or colonoscopy can be done until clearance from cardiology.  Continue with US and HIDA, strongly consider CT of abd. Will follow with you.  Electronic Signatures: Scot JunElliott, Alysse Rathe T (MD)  (Signed on 22-Apr-15 07:38)  Authored  Last Updated: 22-Apr-15 07:38 by Scot JunElliott, Freddie Nghiem T (MD)

## 2014-12-11 NOTE — Consult Note (Signed)
PATIENT NAME:  Samuel Charles, Damiel D MR#:  [id number] DATE OF BIRTH:  [birth year]  SURGICAL CONSULTATION   DATE OF CONSULTATION:  12/10/2013  CONSULTING PHYSICIAN:  Adah Salvageichard E. Excell Seltzerooper, MD  CHIEF COMPLAINT: Abdominal pain.   HISTORY OF PRESENT ILLNESS: This is an 81 year old male patient with significant cardiac disease, who has been having several months of right upper quadrant pain which is postprandial, associated with fatty foods, and who was admitted to the hospital 2 days ago with dehydration, and a workup included a CAT scan that showed likely severe acute appendicitis. I have reviewed the CT scan independently with Dr. Grace IsaacWatts from radiology, who is in agreement that the original dictation and interpretation of the CT scan is correct and that he does have acute appendicitis of a severe nature.   I have discussed this case with the [facility] admitting physician as well.   PAST MEDICAL HISTORY: Significant coronary artery disease. He was scheduled for cardiac catheterization tomorrow and has been seen by cardiology today in anticipation that he would require an emergent appendectomy, and he has been cleared to have a catheterization at a later date.   Additional past medical history includes hyperlipidemia, hypertension and reflux disease.   PAST SURGICAL HISTORY: Denies any abdominal surgery.   MEDICATIONS: Multiple, see chart. Heparin drip has been stopped.   FAMILY HISTORY: Noncontributory.   ALLERGIES: None.   SOCIAL HISTORY: The patient does not smoke and rarely drinks alcohol. He is a retired Education officer, environmentalmill worker.   REVIEW OF SYSTEMS: A 10-system review is performed and negative with the exception of that mentioned in the HPI.   PHYSICAL EXAMINATION:  GENERAL: Arn MedalFrail-appearing male patient in no acute distress (he denies any abdominal pain at this point).  VITAL SIGNS: He is afebrile, pulse of 84, respirations 18, blood pressure 101/52.  HEENT: Showed no scleral icterus.  NECK: No palpable  neck nodes.  CHEST: Clear to auscultation.  CARDIAC: Regular rate and rhythm.  ABDOMEN: Soft. Moderately tender at McBurney's point, plus/minus Rovsing sign. Minimal percussion tenderness.  EXTREMITIES: Without edema.  NEUROLOGIC: Grossly intact.  INTEGUMENT: No jaundice.   IMAGING: CT scan is personally reviewed in multiple views, showing severe appendicitis. This was reviewed with Dr. Grace IsaacWatts as well.   LABORATORY DATA: PTT is 48.7 this morning at 0700, but that has been due to heparin and has been stopped.   Laboratory values performed yesterday demonstrate white blood cell count of 7.7 and a platelet count of 161. Troponins are elevated. Electrolytes are within normal limits.   ASSESSMENT AND PLAN: This is a patient with acute appendicitis. He does have significant cardiac disease, but has been cleared from a cardiac standpoint for emergent appendectomy, and the option of catheterization prior to emergent appendectomy has been discussed with both the patient and [facility] and his family. The risks of a ruptured appendix are always present as well, which were outlined in detail. Plan would be for emergent appendectomy using a laparoscopic approach. The risks of bleeding, infection, recurrent disease, negative laparoscopy, conversion to an open procedure, bowel injury as well as the risk of cardiac event under anesthesia were reviewed with him and his family. They understood and agreed to proceed. I have discussed this case with [facility] admitting physician.   ____________________________ Adah Salvageichard E. Excell Seltzerooper, MD rec:lb D: 12/10/2013 11:06:35 ET T: 12/10/2013 11:14:39 ET JOB#: [id number]  cc: Adah Salvageichard E. Excell Seltzerooper, MD, <Dictator> Lattie HawICHARD E Merri Dimaano MD ELECTRONICALLY SIGNED 12/10/2013 14:29

## 2014-12-11 NOTE — Op Note (Signed)
PATIENT NAME:  Samuel Charles, Lindwood D MR#:  [id number] DATE OF BIRTH:  1929/07/20  DATE OF PROCEDURE:  12/10/2013  PREOPERATIVE DIAGNOSIS: Acute appendicitis.   POSTOPERATIVE DIAGNOSIS: Acute ruptured gangrenous appendicitis.   PROCEDURE: Laparoscopic appendectomy.   SURGEON: Dionne Miloichard Nusrat Encarnacion, M.D.   ASSISTANT: Adora FridgeJason Storm, PA-S.   INDICATIONS: This is a patient with progressive pain and tenderness and a work-up showing acute appendicitis. CT has been reviewed with multiple radiologists. Preoperatively, we discussed the rationale for surgery, the options of observation, risks of bleeding, infection, recurrence of symptoms, negative laparoscopy and conversion to an open procedure. We also discussed his cardiac risk with a recent myocardial infarction. He has been cleared from a cardiac standpoint as well.   FINDINGS: Acute ruptured appendicitis with a small pus pocket near the edge of the appendix.   DESCRIPTION OF PROCEDURE: The patient was induced to general anesthesia. He was on IV antibiotics. A Foley catheter was placed. He was prepped and draped in a sterile fashion. Marcaine was infiltrated in the skin and subcutaneous tissues around the periumbilical area. Incision was made. Veress needle was placed. Pneumoperitoneum was obtained. A 5 mm trocar port was placed. The abdominal cavity was explored and under direct vision a 5 mm suprapubic port and a left lateral 12 mm port was placed. A large amount of inflammation in the right lower quadrant and right pelvis was encountered. Gentle dissection of this area revealed intense inflammatory process and moving from lateral to medial, a small purulent pocket was entered. Photos were taken. Irrigation was performed and dissection around, what appeared to be. the appendix proved to be necrotic epiploica. This was taken with clips and sent off for examination with the subsequent appendix, see below. The appendix was identified and elevated. The base of the  appendix was identified and stapled with a standard load Endo GIA. Two loads of the vascular load Endo GIA was fired across the mesoappendix and the specimen was passed out through the lateral port site with the aid of an Endo catch bag. A small piece of stool had fallen from the ruptured portion of the appendix, which was retrieved and sent with the specimen. The area was irrigated with copious amounts of normal saline. There seemed to be an area where possible abscess pocket could collect, therefore drain was contemplated.   The left lateral port site was closed after insuring hemostasis was adequate. Irrigation was complete. With simple sutures of 0 Vicryl utilizing an Endo Close technique, the sutures were not tied, but the port was replaced and then the suprapubic port was utilized to place a 10 mm JP drain into the abscess cavity in the pelvis. This was tied in with 3-0 nylon and then the port was removed in the left lateral port site and the previously placed Endo Close Vicryl sutures were tied as well. Again, hemostasis was adequate. The camera was removed. This last remaining port at the umbilical site was removed after releasing pneumoperitoneum. A 4-0 subcuticular Monocryl was used on all skin edges. Steri-Strips, Mastisol and sterile dressings were placed and the drain was placed to bulb suction. The patient tolerated the procedure well. There were no complications. He was taken to the recovery room in stable condition to be admitted for continued care.   ____________________________ Adah Salvageichard E. Excell Seltzerooper, MD rec:aw D: 12/10/2013 15:15:51 ET T: 12/10/2013 15:35:05 ET JOB#: [id number]  cc: Adah Salvageichard E. Excell Seltzerooper, MD, <Dictator> Lattie HawICHARD E Amalia Edgecombe MD ELECTRONICALLY SIGNED 12/10/2013 18:22

## 2015-09-30 DIAGNOSIS — Z125 Encounter for screening for malignant neoplasm of prostate: Secondary | ICD-10-CM | POA: Diagnosis not present

## 2015-09-30 DIAGNOSIS — E784 Other hyperlipidemia: Secondary | ICD-10-CM | POA: Diagnosis not present

## 2015-09-30 DIAGNOSIS — R5381 Other malaise: Secondary | ICD-10-CM | POA: Diagnosis not present

## 2015-09-30 DIAGNOSIS — I10 Essential (primary) hypertension: Secondary | ICD-10-CM | POA: Diagnosis not present

## 2015-11-30 IMAGING — US ABDOMEN ULTRASOUND LIMITED
1 series · 14 of 25 positions shown · non-contrast
Comparison: None.

CLINICAL DATA: Right upper quadrant abdomen pain. Assess for acute
cholecystitis.

EXAM:
US ABDOMEN LIMITED - RIGHT UPPER QUADRANT

[Series 1: abdomen ultrasound limited · 0.28mm/px · 14 of 42 slices shown]
[im 1/42]
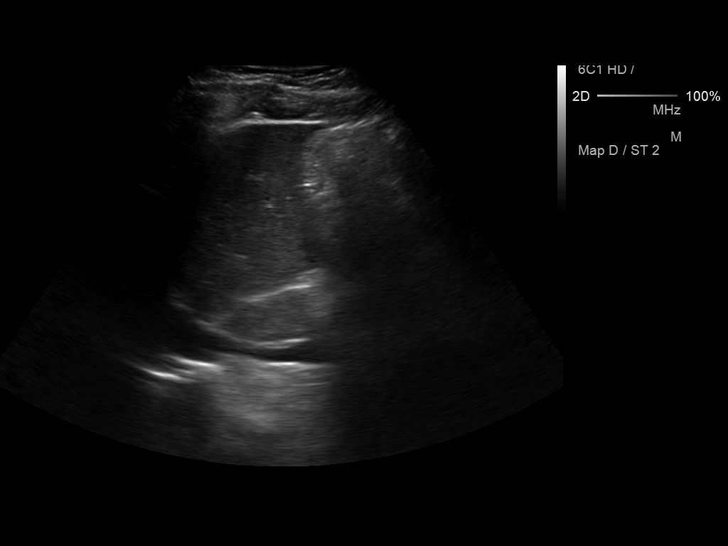
[im 4/42]
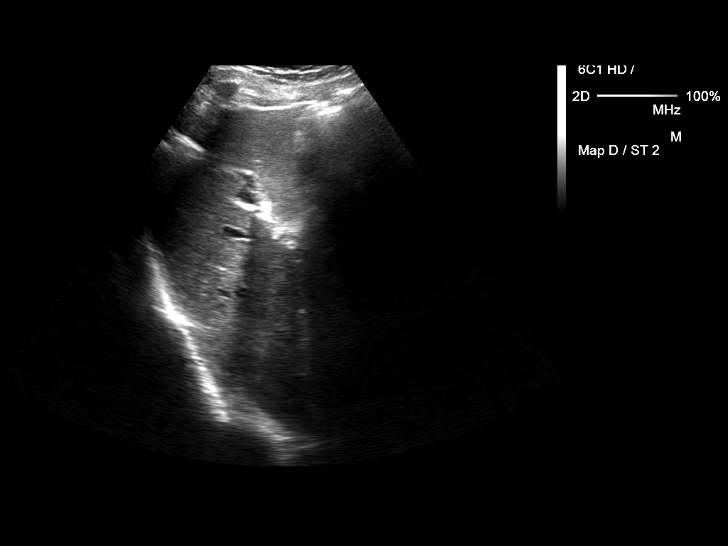
[im 7/42]
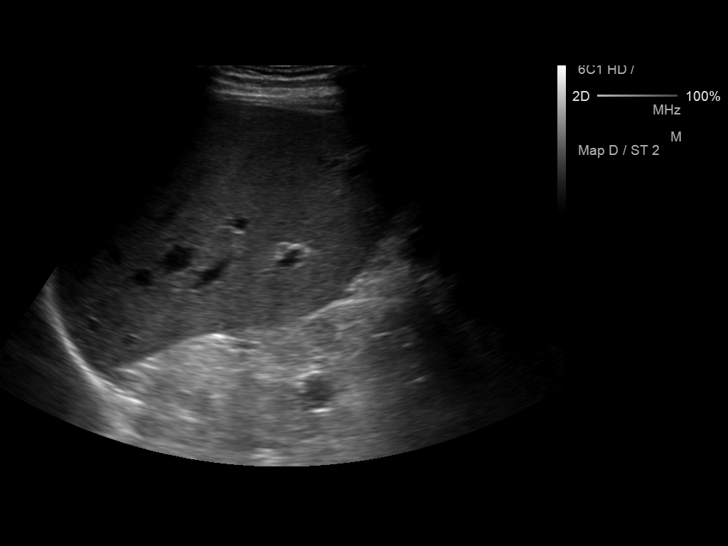
[im 11/42]
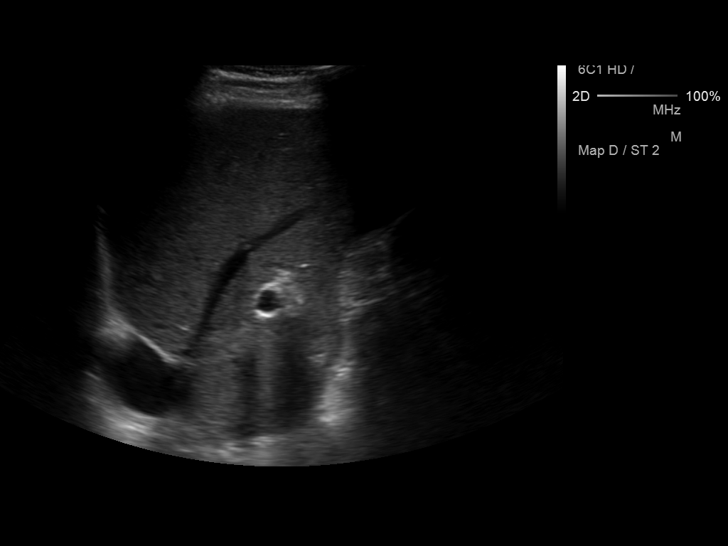
[im 14/42]
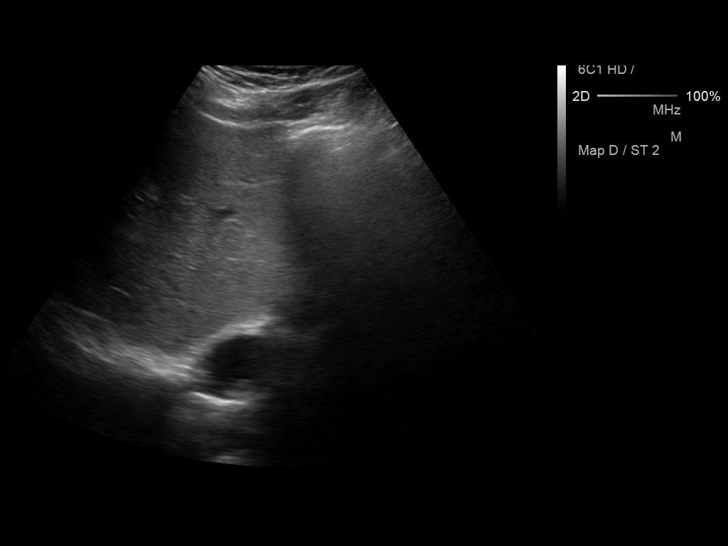
[im 16/42]
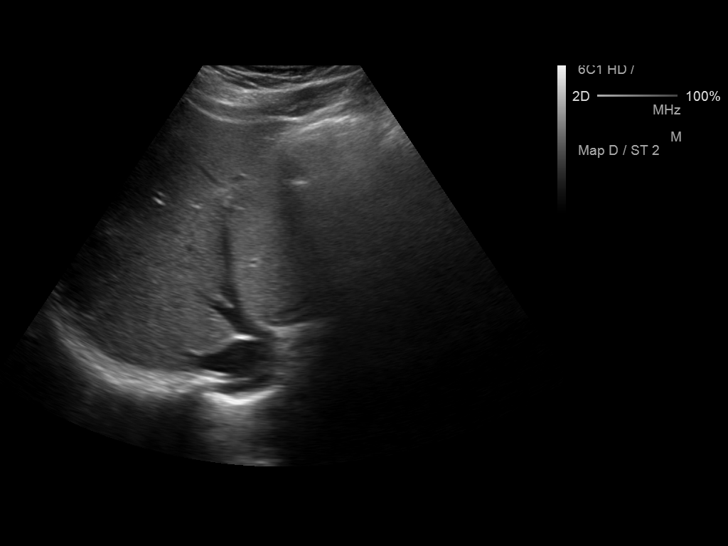
[im 19/42]
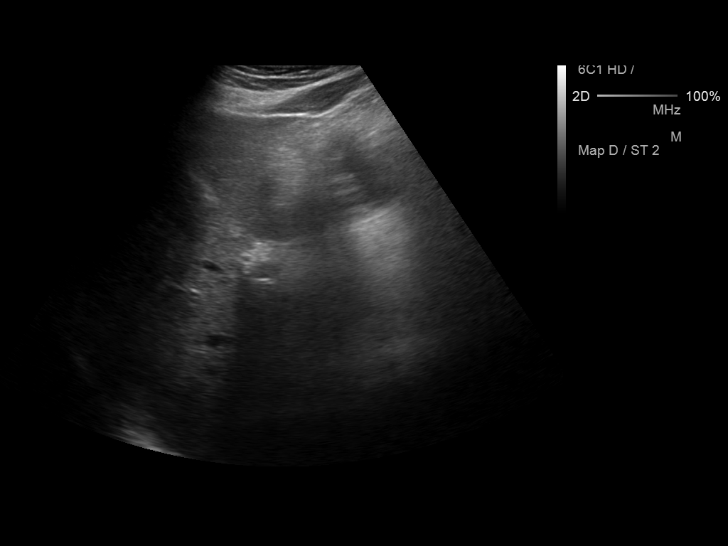
[im 23/42]
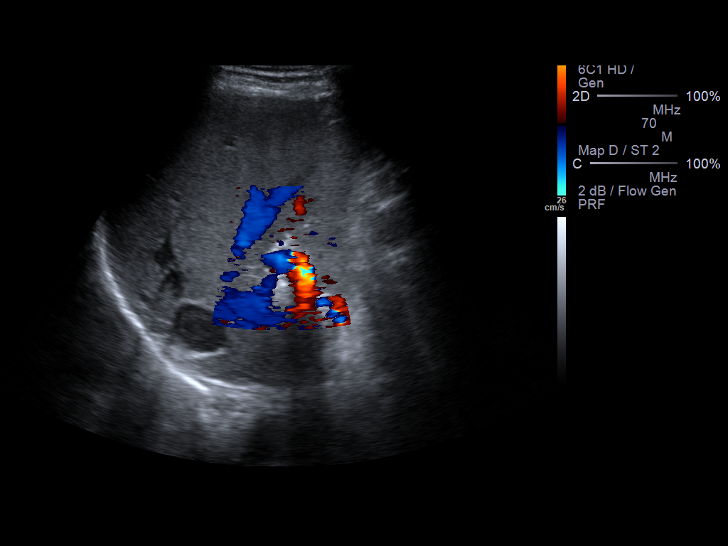
[im 26/42]
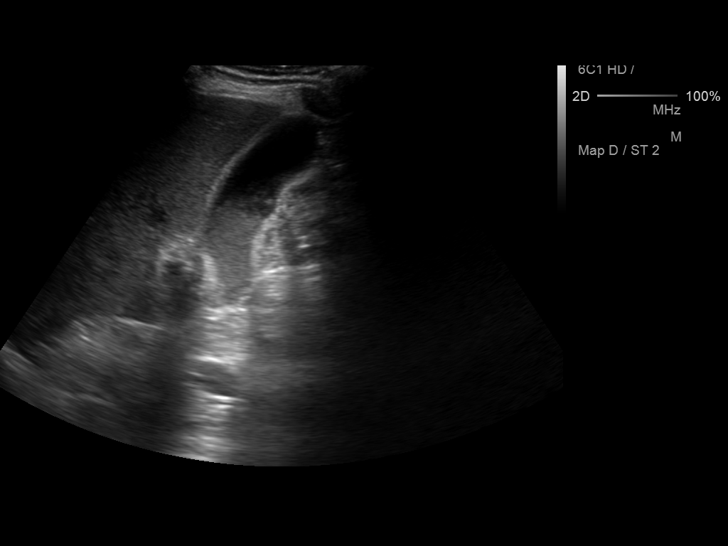
[im 28/42]
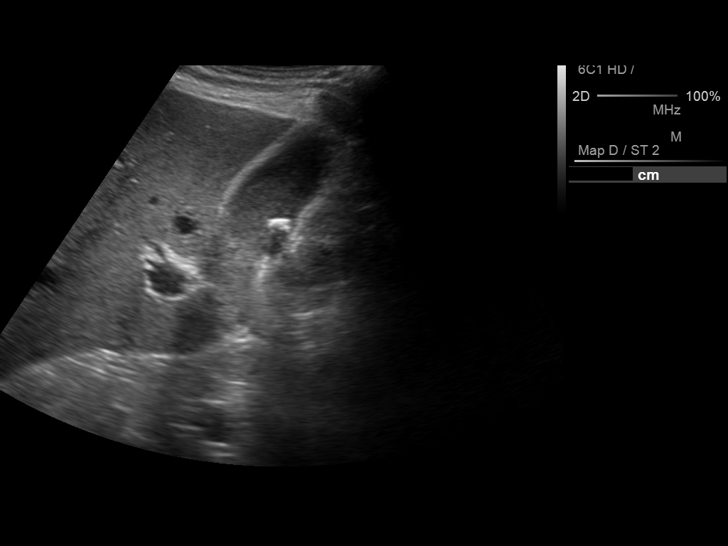
[im 31/42]
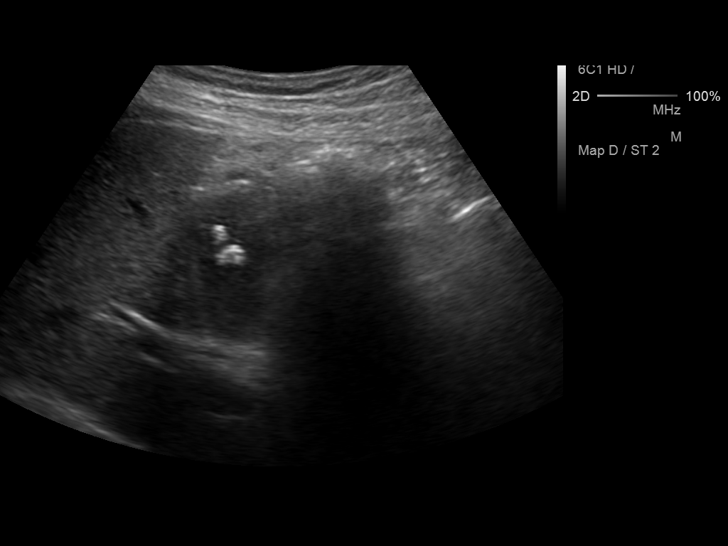
[im 35/42]
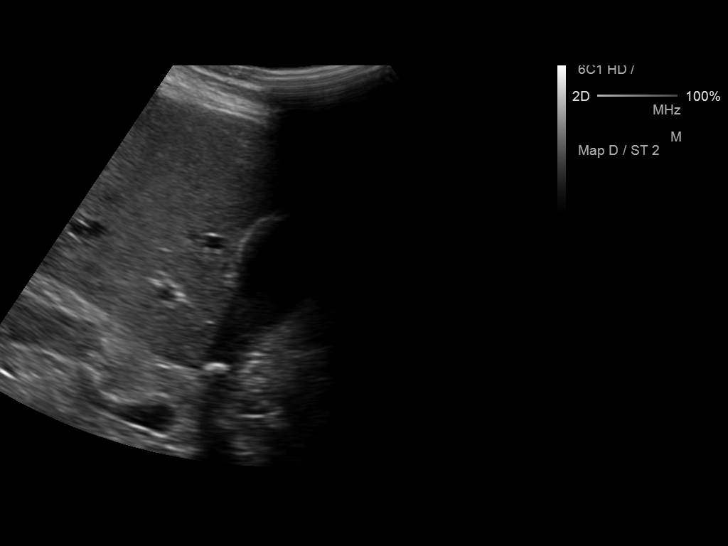
[im 38/42]
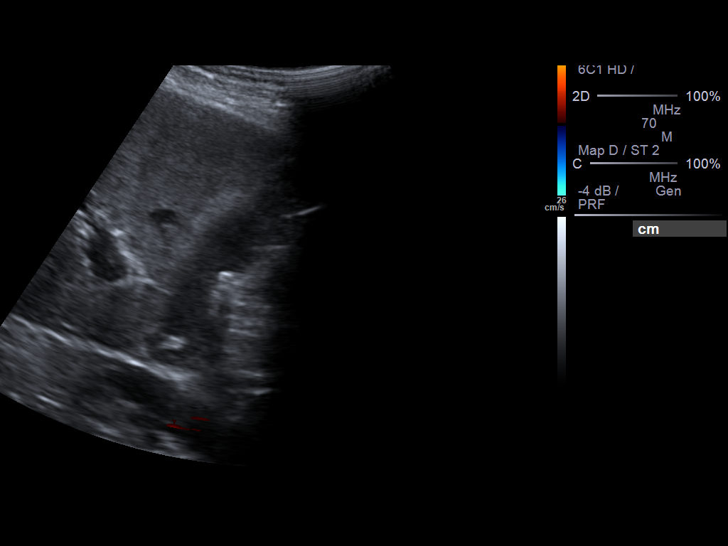
[im 42/42]
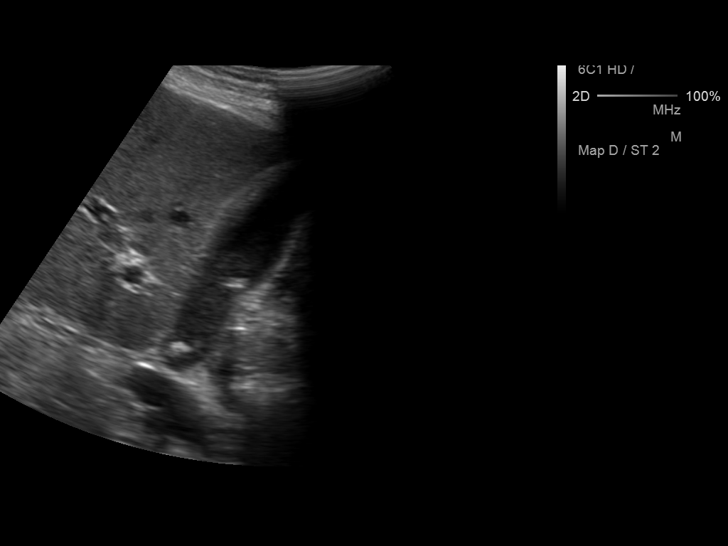

[14 of 25 positions shown; findings below may reference images not displayed]

FINDINGS: Gallbladder:

There are sludge and gallstones within the gallbladder. There is no
sonographic Murphy sign. No pericholecystic fluid is noted.

Common bile duct:

Diameter: 6.9 mm.

Liver:

No focal lesion identified. Within normal limits in parenchymal
echogenicity.

Others:

There is incidental note of a 3.7 cm simple cyst in the midpole
right kidney.
IMPRESSION: Cholelithiasis without sonographic evidence of acute cholecystitis.

## 2015-12-01 IMAGING — CT CT ABD-PELV W/O CM
2 of 4 series · 15 of 46 positions shown, 17 images · non-contrast
Comparison: No priors.

CLINICAL DATA: Abdominal pain.

EXAM:
CT ABDOMEN AND PELVIS WITHOUT CONTRAST
TECHNIQUE: Multidetector CT imaging of the abdomen and pelvis was performed
following the standard protocol without intravenous contrast.

[Series 2: routine abd pel without · axial · non-contrast · 0.72mm/px · z∈[-636,-242]mm · 12 of 87 slices shown, 14 images]
[im 4/87  soft-tissue]
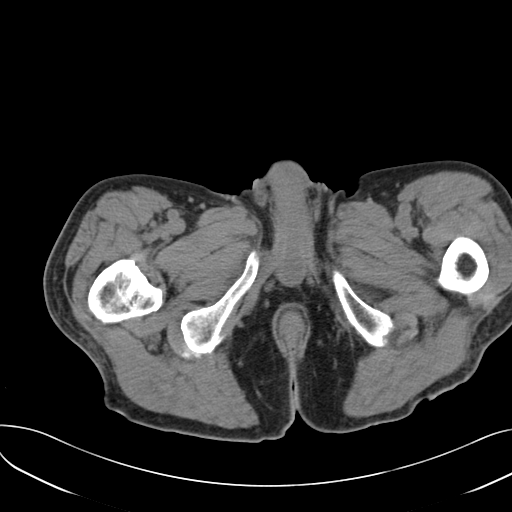
[im 4/87  bone]
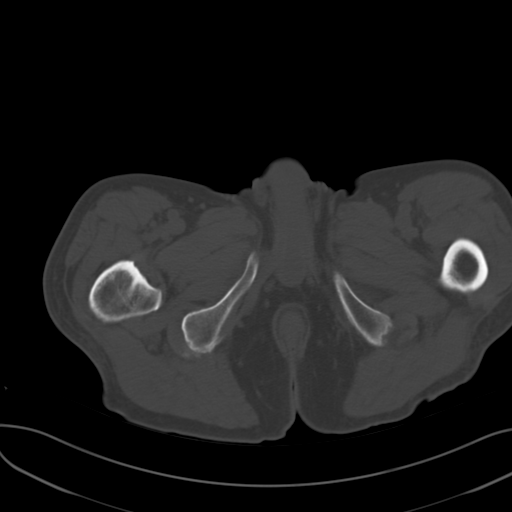
[im 11/87  soft-tissue]
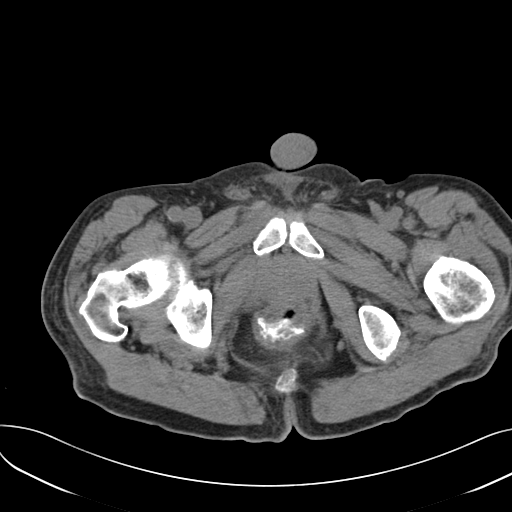
[im 18/87  soft-tissue]
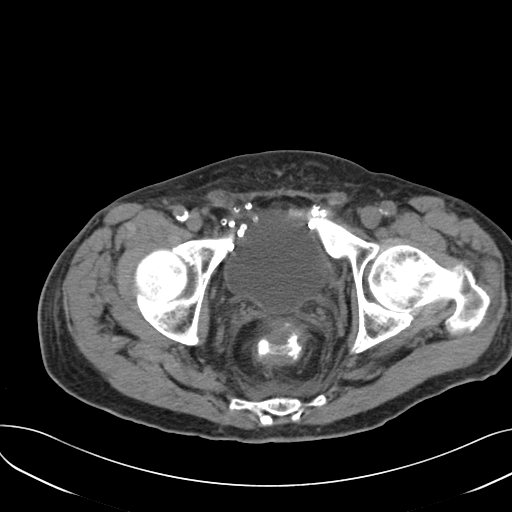
[im 26/87  soft-tissue]
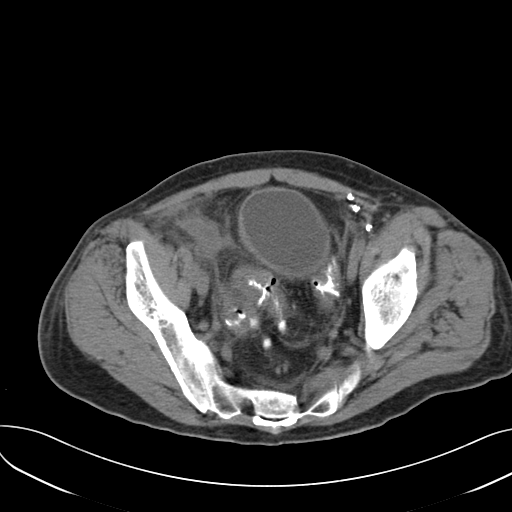
[im 33/87  soft-tissue]
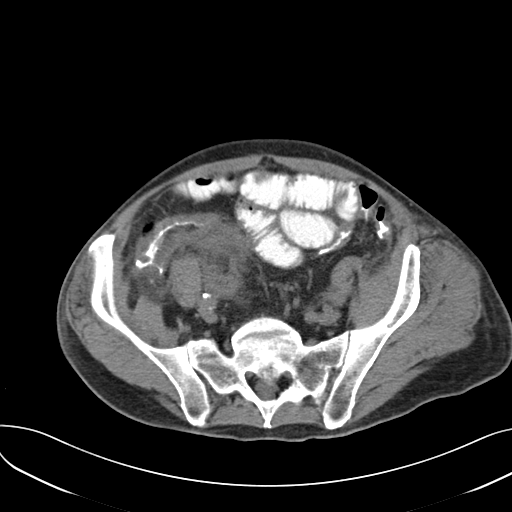
[im 40/87  soft-tissue]
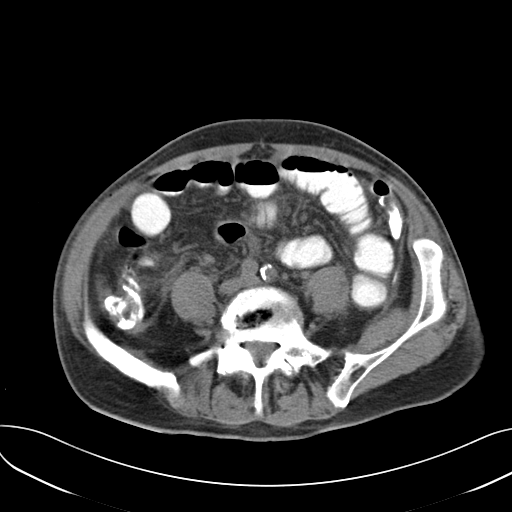
[im 47/87  soft-tissue]
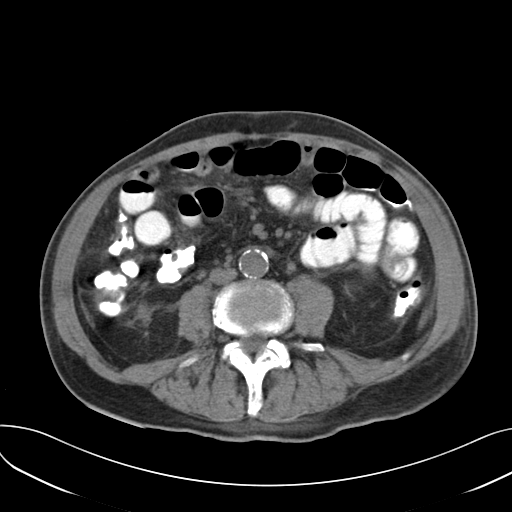
[im 54/87  soft-tissue]
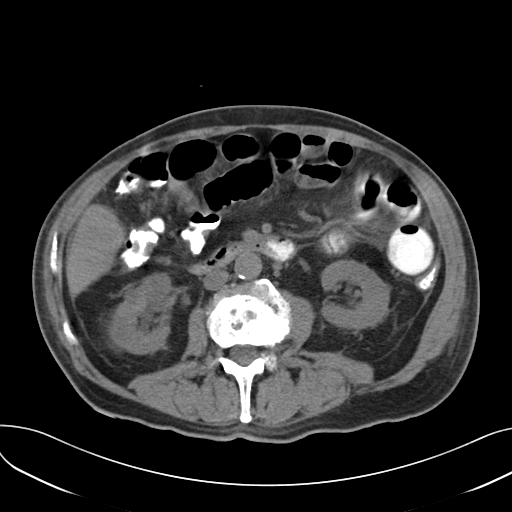
[im 61/87  soft-tissue]
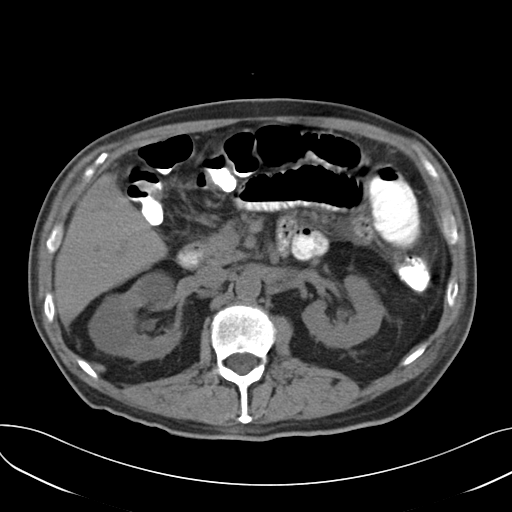
[im 61/87  bone]
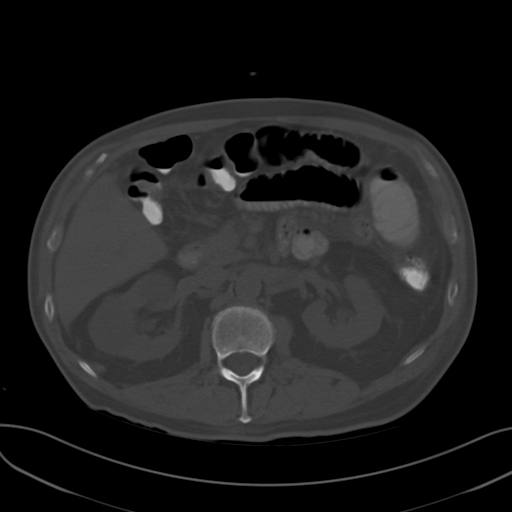
[im 69/87  soft-tissue]
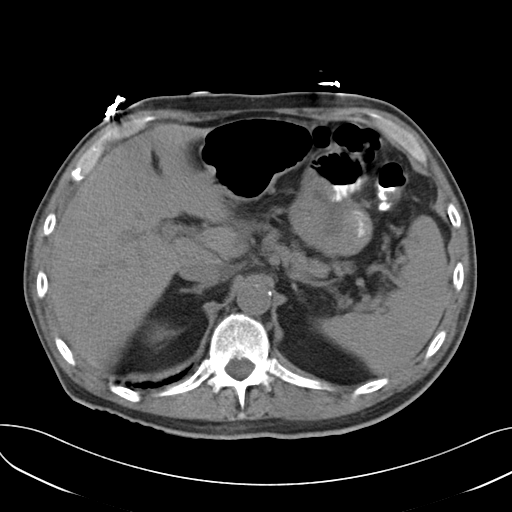
[im 76/87  soft-tissue]
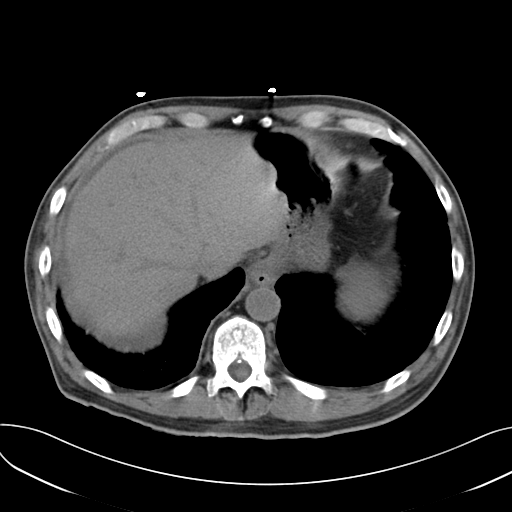
[im 83/87  soft-tissue]
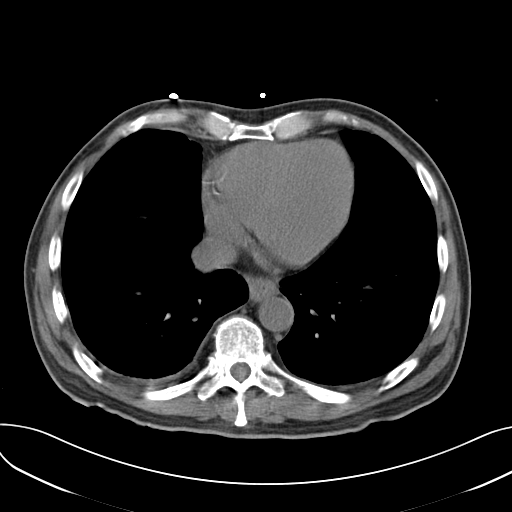

[Series 5: cor routine abd pel wo · coronal · 0.78mm/px · 3 of 128 slices shown]
[im 43/128  soft-tissue]
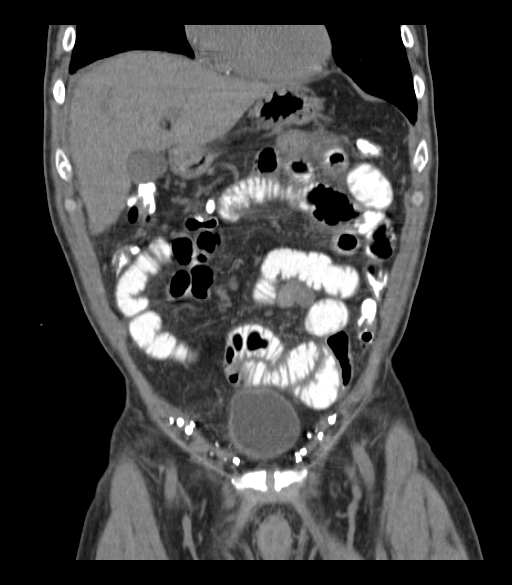
[im 57/128  soft-tissue]
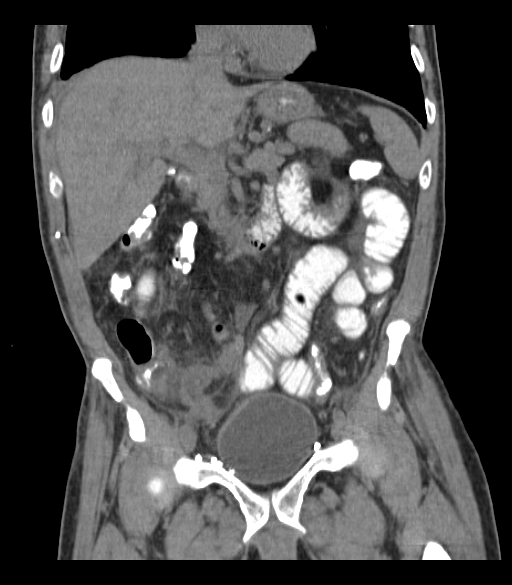
[im 71/128  soft-tissue]
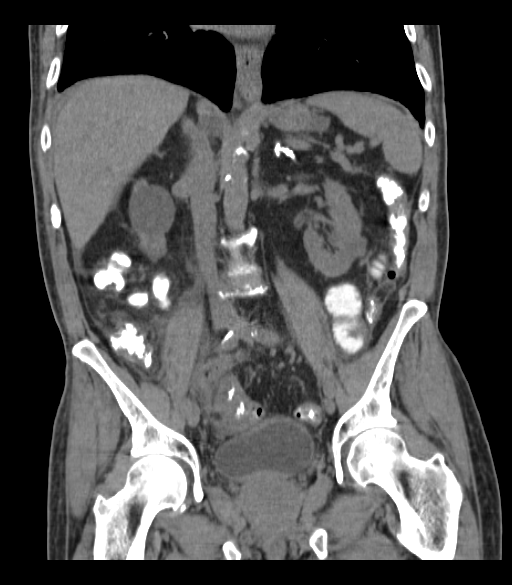

[15 of 46 positions shown; findings below may reference images not displayed]

FINDINGS: Lung Bases: Small amount of pleural thickening in the posterior
aspect of the right hemithorax. Atherosclerotic calcifications in
the right coronary artery. Small hiatal hernia.

Abdomen/Pelvis: The appendix appears markedly enlarged and
thickened, best demonstrated on axial image 56 of series 2 and
coronal image 72 of series 5 where it measures approximately 15-16
mm in diameter. There are extensive periappendiceal inflammatory
changes. The adjacent portion of the mid to distal rectum also
appears thickened, likely secondarily. The adjacent portions of the
cecum also appear markedly thickened. At this time, no well-defined
periappendiceal abscess is identified, although there is a small
amount of periappendiceal fluid. Trace volume of ascites.

Two gallstones are present in the neck of the gallbladder, largest
of which measures 9 mm. No current findings to suggest acute
cholecystitis at this time. The unenhanced appearance of the liver,
pancreas, spleen and right adrenal gland is unremarkable. Extensive
calcifications throughout the left adrenal gland, presumably related
to prior infection or prior hemorrhage. There are multiple renal
lesions bilaterally which appear predominantly low-attenuation,
incompletely characterized on today's non contrast CT examination.
These are favored to represent cysts, with the largest lesion
measuring up to 5.3 cm in the lateral aspect of the interpolar
region of the right kidney. Numerous prominent but nondilated loops
of small bowel may reflect a developing reactive ileus. Mild diffuse
mesenteric edema. Numerous borderline enlarged ileocolic lymph nodes
are presumably reactive. No pneumoperitoneum. Prostate gland and
urinary bladder are unremarkable in appearance. Postoperative
changes of bilateral inguinal herniorrhaphy are noted.

Musculoskeletal: Multilevel degenerative disc disease, most severe
at L2-L3 where there is 2 mm of retrolisthesis of L2 upon L3 in
advanced discogenic changes. L3 vertebral body is remarkable for a
large cavernous hemangioma. There are no aggressive appearing lytic
or blastic lesions noted in the visualized portions of the skeleton.
IMPRESSION: 1. Findings, as above, compatible with severe acute appendicitis
with small amount of surrounding free fluid, but no definite
periappendiceal abscess or other indicators of frank perforation at
this time. Emergent surgical consultation is strongly recommended.
2. Trace volume of ascites is presumably reactive.
3. Cholelithiasis without evidence to suggest acute cholecystitis at
this time.
4. Small amount of pleural thickening in the posterior aspect of the
right hemithorax.
5. Extensive the calcified left adrenal gland presumably related to
remote prior adrenal infection or hemorrhage.
6. Multiple renal lesions bilaterally are incompletely characterized
on today's examination, but favored to represent cysts. This could
be confirmed with nonemergent renal ultrasound if clinically
indicated.
7. Additional incidental findings, as above.
Critical Value/emergent results were called by telephone at the time
of interpretation on 12/10/2013 at [DATE] to nurse Nivirus for Dr.
DANMARKS DERDAU, who verbally acknowledged these results.

## 2016-04-20 DEATH — deceased
